# Patient Record
Sex: Female | Born: 1960 | Race: Black or African American | Hispanic: No | Marital: Married | Smoking: Never smoker
Health system: Southern US, Community
[De-identification: ages and names within clinical notes are randomized; demographics above are authoritative.]

## PROBLEM LIST (undated history)

## (undated) DIAGNOSIS — N809 Endometriosis, unspecified: Secondary | ICD-10-CM

## (undated) DIAGNOSIS — N8003 Adenomyosis of the uterus: Secondary | ICD-10-CM

## (undated) DIAGNOSIS — R0989 Other specified symptoms and signs involving the circulatory and respiratory systems: Secondary | ICD-10-CM

## (undated) DIAGNOSIS — N800 Endometriosis of uterus: Secondary | ICD-10-CM

## (undated) DIAGNOSIS — D219 Benign neoplasm of connective and other soft tissue, unspecified: Secondary | ICD-10-CM

## (undated) DIAGNOSIS — D649 Anemia, unspecified: Secondary | ICD-10-CM

## (undated) HISTORY — DX: Endometriosis of uterus: N80.0

## (undated) HISTORY — PX: BREAST BIOPSY: SHX20

## (undated) HISTORY — DX: Benign neoplasm of connective and other soft tissue, unspecified: D21.9

## (undated) HISTORY — DX: Endometriosis, unspecified: N80.9

## (undated) HISTORY — DX: Adenomyosis of the uterus: N80.03

## (undated) HISTORY — DX: Other specified symptoms and signs involving the circulatory and respiratory systems: R09.89

---

## 2000-11-10 HISTORY — PX: TUBAL LIGATION: SHX77

## 2002-02-22 ENCOUNTER — Encounter: Payer: Self-pay | Admitting: Ophthalmology

## 2002-02-22 ENCOUNTER — Encounter: Admission: RE | Admit: 2002-02-22 | Discharge: 2002-02-22 | Payer: Self-pay | Admitting: Ophthalmology

## 2004-09-28 ENCOUNTER — Encounter: Admission: RE | Admit: 2004-09-28 | Discharge: 2004-09-28 | Payer: Self-pay | Admitting: General Surgery

## 2004-10-06 ENCOUNTER — Encounter: Admission: RE | Admit: 2004-10-06 | Discharge: 2004-10-06 | Payer: Self-pay | Admitting: General Surgery

## 2005-03-30 ENCOUNTER — Encounter: Admission: RE | Admit: 2005-03-30 | Discharge: 2005-03-30 | Payer: Self-pay | Admitting: General Surgery

## 2005-10-11 ENCOUNTER — Encounter: Admission: RE | Admit: 2005-10-11 | Discharge: 2005-10-11 | Payer: Self-pay | Admitting: General Surgery

## 2006-12-29 ENCOUNTER — Other Ambulatory Visit: Admission: RE | Admit: 2006-12-29 | Discharge: 2006-12-29 | Payer: Self-pay | Admitting: Family Medicine

## 2007-01-03 ENCOUNTER — Ambulatory Visit (HOSPITAL_COMMUNITY): Admission: RE | Admit: 2007-01-03 | Discharge: 2007-01-03 | Payer: Self-pay | Admitting: Family Medicine

## 2007-01-03 ENCOUNTER — Ambulatory Visit: Payer: Self-pay | Admitting: Vascular Surgery

## 2011-01-06 ENCOUNTER — Encounter: Payer: Self-pay | Admitting: Family Medicine

## 2011-01-06 ENCOUNTER — Ambulatory Visit (INDEPENDENT_AMBULATORY_CARE_PROVIDER_SITE_OTHER): Payer: Self-pay | Admitting: Family Medicine

## 2011-01-06 VITALS — BP 125/79 | HR 95 | Ht 65.5 in | Wt 161.0 lb

## 2011-01-06 DIAGNOSIS — Z7689 Persons encountering health services in other specified circumstances: Secondary | ICD-10-CM

## 2011-01-06 DIAGNOSIS — Z0189 Encounter for other specified special examinations: Secondary | ICD-10-CM

## 2011-01-06 NOTE — Progress Notes (Signed)
  Subjective:    Patient ID: Belinda Welch, female    DOB: November 04, 1960, 50 y.o.   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 She will start on 01/10/11.  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  PCP: [facility] MMG: [place], due this month Pap smear: due this month, no history of abn pap LMP: 01/05/11   Review of Systems  Constitutional: Negative.   HENT: Negative.   Eyes: Negative.   Cardiovascular: Negative for chest pain and palpitations.  Gastrointestinal: Negative for nausea, vomiting, constipation, blood in stool and abdominal distention.  Genitourinary: Negative for dysuria, frequency, hematuria and dyspareunia.  Musculoskeletal: Negative for back pain, joint swelling, arthralgias and gait problem.  Neurological: Negative for dizziness, tremors, seizures, syncope, weakness, numbness and headaches.  Hematological: Negative.   Psychiatric/Behavioral: Negative.        Objective:   Physical Exam  Constitutional: She is oriented to person, place, and time. She appears well-developed and well-nourished. No distress.  HENT:  Head: Normocephalic and atraumatic.  Mouth/Throat: Oropharyngeal exudate present.  Eyes: Conjunctivae and EOM are normal. Pupils are equal, round, and reactive to light. Right eye exhibits no discharge. Left eye exhibits no discharge. No scleral icterus.  Neck: Normal range of motion. Neck supple. No tracheal deviation present. No thyromegaly present.  Cardiovascular: Normal rate, regular rhythm, normal heart sounds and intact distal pulses.   Pulmonary/Chest: Effort normal and breath sounds normal. No respiratory distress. She has no wheezes.  Abdominal: Soft. Bowel sounds are normal. She exhibits no distension. There is no tenderness.  Musculoskeletal: Normal range of motion. She exhibits no edema and no  tenderness.  Lymphadenopathy:    She has no cervical adenopathy.  Neurological: She is alert and oriented to person, place, and time. She has normal reflexes. No cranial nerve deficit. Coordination normal.  Skin: Skin is warm and dry.  Psychiatric: She has a normal mood and affect. Her behavior is normal. Judgment and thought content normal.          Assessment & Plan:

## 2011-01-06 NOTE — Assessment & Plan Note (Signed)
Pt is starting work at [employer] at [employer]. PCP is [facility].  She is healthy and takes no medications.  We discussed preventative health (weight, MMGs, Pap smear, colonscopy).  Physical exam wnl.  I've signed paperwork for pt to start 01/10/11.

## 2011-01-11 DIAGNOSIS — R0989 Other specified symptoms and signs involving the circulatory and respiratory systems: Secondary | ICD-10-CM

## 2011-01-11 HISTORY — DX: Other specified symptoms and signs involving the circulatory and respiratory systems: R09.89

## 2011-01-27 ENCOUNTER — Other Ambulatory Visit (HOSPITAL_COMMUNITY)
Admission: RE | Admit: 2011-01-27 | Discharge: 2011-01-27 | Disposition: A | Discharge status: Home / Self Care | Payer: Managed Care, Other (non HMO) | Source: Ambulatory Visit | Attending: Family Medicine | Admitting: Family Medicine

## 2011-01-27 ENCOUNTER — Other Ambulatory Visit: Payer: Self-pay | Admitting: Family Medicine

## 2011-01-27 DIAGNOSIS — Z0000 Encounter for general adult medical examination without abnormal findings: Secondary | ICD-10-CM | POA: Insufficient documentation

## 2011-08-02 ENCOUNTER — Encounter (HOSPITAL_COMMUNITY): Payer: Self-pay | Admitting: *Deleted

## 2011-08-10 ENCOUNTER — Inpatient Hospital Stay (HOSPITAL_COMMUNITY)
Admission: RE | Admit: 2011-08-10 | Payer: Managed Care, Other (non HMO) | Source: Ambulatory Visit | Admitting: Obstetrics and Gynecology

## 2011-08-10 ENCOUNTER — Encounter (HOSPITAL_COMMUNITY): Admission: RE | Payer: Self-pay | Source: Ambulatory Visit

## 2011-08-10 SURGERY — DILATATION & CURETTAGE/HYSTEROSCOPY WITH HYDROTHERMAL ABLATION
Anesthesia: General

## 2011-08-13 ENCOUNTER — Other Ambulatory Visit: Payer: Self-pay | Admitting: Obstetrics and Gynecology

## 2011-08-13 NOTE — H&P (Signed)
07/25/2011  History of Present Illness  General:  50 y/o presents for f/u on menorrhagia. Pt used Lysteda and it worked well. Pt does not want to continue to take pills so she would like to proceed with endometrial ablation.   Current Medications  Nu-Iron 150 MG Capsule 1 capsule Once a day  Vitamin D 1000 UNIT Capsule 4 capsule Once a day  Lysteda 650 MG Tablet 2 tablets Three times daily during menses  Aleve 220 MG Capsule 2 tablets during menses  Medication List reviewed and reconciled with the patient   Past Medical History  vitamin D deficiency, severe 5/12 7.0  Carotid bruit, carotid doppler, mild plaque bilaterally 5/12  Fibroid vs adenomyosis   Surgical History  BTL    Family History  Father: alive hypertension, diabetes, cancer prostate   Mother: alive hypertension, cholesterol, breast cancer 15   Paternal Grand Father: deceased DM   Paternal Grand Mother: deceased DM   Maternal Grand Father: deceased ? CAD   Maternal Grand Mother: deceased hypertension, colon cancer   Sister 1: alive hypertension   Sister 2: alive    Social History  General:  History of smoking  cigarettes: Never smoked no Smoking.  no Alcohol.  no Recreational drug use.  Exercise: yes, 1- times per week, Zumba.  Occupation: Data processing manager, Engineer, technical sales at [employer].  Marital Status: married, Dwain, Microbiologist for [employer].  Children: Jared -14, Myles (10).  Religion: Baptist.  Seat belt use: yes.    Gyn History  Periods : every month.  LMP 07/19/11.  Birth control BTL.  Last pap smear date 01/27/11, WNL.  Last mammogram date 03/18/11.  Denies H/O Abnormal pap smear .  Denies H/O STD .  GYN procedures Pelvic U/S 05/18/11.    OB History  Number of pregnancies 2.  Pregnancy # 1 live birth, vaginal delivery.  Pregnancy # 2 live birth, vaginal delivery.    Allergies  Tetracycline HCl: nausea   Hospitalization/Major Diagnostic Procedure  childbirth 97, 02   Review of  Systems  See HPI.   Vital Signs  Wt 166, Wt change 2 lb, Ht 65, BMI 27.62, Pulse sitting 76, BP sitting 118/78.   Physical Examination  GENERAL:  Patient appears alert and oriented.  General Appearance: well-appearing, well-developed, no acute distress.  Speech: clear.  NECK:  Thyroid: no thyromegaly.  LUNGS:  General clear bilaterally, no crackles, no wheezes.  HEART:  Heart sounds: normal, RRR.  ABDOMEN:  General: no masses tenderness or organomegaly.  FEMALE GENITOURINARY:  Cervix visualized, healthy appearing, no discharge, no lesions.  Adnexa: no mass, non tender.  Uterus: freely mobile, non tender, irregularly enlarged 14 weeks.  Vagina: pink/moist mucosa, no lesions, no abnormal discharge.  Vulva: normal, no lesions, no skin discoloration.  Anus: no external hemosrhoids.  EXTREMITIES:  general no edema.     Assessments   1. Menorrhagia with regular cycle - 626.2 (Primary)   Treatment  1. Menorrhagia with regular cycle  LAB: CBC WITH DIFF   WBC 7.1 4.0-11.0 - K/uL   RBC 4.20 4.20-5.40 - M/uL   HGB 11.2 12.0-16.0 - g/dL L  HCT 78.2 95.6-21.3 - % L  MCV 81.6 81.0-99.0 - fL   MCHC 32.6 32.0-36.0 - g/dL   RDW 08.6 57.8-46.9 - % H  PLT 351 150-400 - K/uL   NEUT % 62.0 43.3-71.9 - %   LYMPH% 24.6 16.8-43.5 - %   MONO % 11.4 4.6-12.4 - %   EOS % 1.20 0.00-7.80 - %  BASO % 0.8 0.0-1.0 - %   NEUT # 4.4 1.9-7.2 - K/uL   LYMPH# 1.70 1.10-2.73 - K/uL   MONO # 0.8 0.3-0.8 - K/uL   EOS # 0.10 0.00-0.60 - K/uL   BASO # 0.10 0.00-0.10 - K/uL    Janecia Palau B 07/26/2011 05:28:02 PM > Much improved. Continue iron therapy. Allman,Michelle 07/27/2011 03:53:28 PM > pt informed.   Proceed with HTA ablation. Preop clearance by PCP. Return for preoop.

## 2011-08-16 ENCOUNTER — Encounter (HOSPITAL_COMMUNITY): Payer: Self-pay | Admitting: Pharmacist

## 2011-08-24 ENCOUNTER — Ambulatory Visit (HOSPITAL_COMMUNITY): Payer: Managed Care, Other (non HMO) | Admitting: Anesthesiology

## 2011-08-24 ENCOUNTER — Encounter (HOSPITAL_COMMUNITY)
Admission: RE | Disposition: A | Discharge status: Home / Self Care | Payer: Self-pay | Source: Ambulatory Visit | Attending: Obstetrics and Gynecology

## 2011-08-24 ENCOUNTER — Other Ambulatory Visit: Payer: Self-pay | Admitting: Obstetrics and Gynecology

## 2011-08-24 ENCOUNTER — Ambulatory Visit (HOSPITAL_COMMUNITY)
Admission: RE | Admit: 2011-08-24 | Discharge: 2011-08-24 | Disposition: A | Discharge status: Home / Self Care | Payer: Managed Care, Other (non HMO) | Source: Ambulatory Visit | Attending: Obstetrics and Gynecology | Admitting: Obstetrics and Gynecology

## 2011-08-24 ENCOUNTER — Encounter (HOSPITAL_COMMUNITY): Payer: Self-pay | Admitting: *Deleted

## 2011-08-24 ENCOUNTER — Encounter (HOSPITAL_COMMUNITY): Payer: Self-pay | Admitting: Anesthesiology

## 2011-08-24 DIAGNOSIS — N920 Excessive and frequent menstruation with regular cycle: Secondary | ICD-10-CM | POA: Insufficient documentation

## 2011-08-24 HISTORY — PX: ABLATION: SHX5711

## 2011-08-24 HISTORY — DX: Anemia, unspecified: D64.9

## 2011-08-24 LAB — DIFFERENTIAL
Basophils Absolute: 0 10*3/uL (ref 0.0–0.1)
Basophils Relative: 1 % (ref 0–1)
Eosinophils Absolute: 0.1 10*3/uL (ref 0.0–0.7)
Monocytes Relative: 9 % (ref 3–12)
Neutrophils Relative %: 58 % (ref 43–77)

## 2011-08-24 LAB — CBC
Hemoglobin: 11.8 g/dL — ABNORMAL LOW (ref 12.0–15.0)
MCH: 25.7 pg — ABNORMAL LOW (ref 26.0–34.0)
MCHC: 31.8 g/dL (ref 30.0–36.0)
RDW: 18.3 % — ABNORMAL HIGH (ref 11.5–15.5)

## 2011-08-24 LAB — TYPE AND SCREEN: ABO/RH(D): O POS

## 2011-08-24 SURGERY — DILATATION & CURETTAGE/HYSTEROSCOPY WITH HYDROTHERMAL ABLATION
Anesthesia: Choice

## 2011-08-24 MED ORDER — OXYCODONE-ACETAMINOPHEN 5-325 MG PO TABS
ORAL_TABLET | ORAL | Status: AC
  Administered 2011-08-24: 1 via ORAL
  Filled 2011-08-24: qty 1

## 2011-08-24 MED ORDER — OXYCODONE-ACETAMINOPHEN 5-325 MG PO TABS
1.0000 | ORAL_TABLET | ORAL | Status: AC
  Administered 2011-08-24: 1 via ORAL

## 2011-08-24 MED ORDER — LACTATED RINGERS IV SOLN
INTRAVENOUS | Status: DC
  Administered 2011-08-24 (×2): via INTRAVENOUS

## 2011-08-24 MED ORDER — LIDOCAINE HCL (CARDIAC) 20 MG/ML IV SOLN
INTRAVENOUS | Status: DC | PRN
  Administered 2011-08-24: 40 mg via INTRAVENOUS

## 2011-08-24 MED ORDER — IBUPROFEN 600 MG PO TABS: 600.0000 mg | ORAL_TABLET | Freq: Four times a day (QID) | ORAL | Status: AC | PRN

## 2011-08-24 MED ORDER — DEXAMETHASONE SODIUM PHOSPHATE 10 MG/ML IJ SOLN
INTRAMUSCULAR | Status: AC
  Filled 2011-08-24: qty 1

## 2011-08-24 MED ORDER — DEXAMETHASONE SODIUM PHOSPHATE 10 MG/ML IJ SOLN
INTRAMUSCULAR | Status: DC | PRN
  Administered 2011-08-24: 10 mg via INTRAVENOUS

## 2011-08-24 MED ORDER — KETOROLAC TROMETHAMINE 30 MG/ML IJ SOLN
INTRAMUSCULAR | Status: AC
  Filled 2011-08-24: qty 1

## 2011-08-24 MED ORDER — FENTANYL CITRATE 0.05 MG/ML IJ SOLN
INTRAMUSCULAR | Status: AC
  Filled 2011-08-24: qty 2

## 2011-08-24 MED ORDER — MIDAZOLAM HCL 2 MG/2ML IJ SOLN
INTRAMUSCULAR | Status: AC
  Filled 2011-08-24: qty 2

## 2011-08-24 MED ORDER — CEFAZOLIN SODIUM 1-5 GM-% IV SOLN
INTRAVENOUS | Status: AC
  Filled 2011-08-24: qty 50

## 2011-08-24 MED ORDER — FENTANYL CITRATE 0.05 MG/ML IJ SOLN
INTRAMUSCULAR | Status: DC | PRN
  Administered 2011-08-24: 100 ug via INTRAVENOUS

## 2011-08-24 MED ORDER — LIDOCAINE HCL (PF) 1 % IJ SOLN
INTRAMUSCULAR | Status: DC | PRN
  Administered 2011-08-24: 10 mL

## 2011-08-24 MED ORDER — ONDANSETRON HCL 4 MG/2ML IJ SOLN
INTRAMUSCULAR | Status: AC
  Filled 2011-08-24: qty 2

## 2011-08-24 MED ORDER — CEFAZOLIN SODIUM 1-5 GM-% IV SOLN
1.0000 g | INTRAVENOUS | Status: AC
  Administered 2011-08-24: 1 g via INTRAVENOUS

## 2011-08-24 MED ORDER — FENTANYL CITRATE 0.05 MG/ML IJ SOLN: 25.0000 ug | INTRAMUSCULAR | Status: DC | PRN

## 2011-08-24 MED ORDER — LIDOCAINE HCL (CARDIAC) 20 MG/ML IV SOLN
INTRAVENOUS | Status: AC
  Filled 2011-08-24: qty 5

## 2011-08-24 MED ORDER — PROPOFOL 10 MG/ML IV EMUL
INTRAVENOUS | Status: AC
  Filled 2011-08-24: qty 20

## 2011-08-24 MED ORDER — KETOROLAC TROMETHAMINE 30 MG/ML IJ SOLN
INTRAMUSCULAR | Status: DC | PRN
  Administered 2011-08-24: 30 mg via INTRAVENOUS

## 2011-08-24 MED ORDER — PROPOFOL 10 MG/ML IV EMUL
INTRAVENOUS | Status: DC | PRN
  Administered 2011-08-24: 200 mg via INTRAVENOUS

## 2011-08-24 MED ORDER — OXYCODONE-ACETAMINOPHEN 5-325 MG PO TABS: ORAL_TABLET | ORAL | Status: DC

## 2011-08-24 MED ORDER — ONDANSETRON HCL 4 MG/2ML IJ SOLN
INTRAMUSCULAR | Status: DC | PRN
  Administered 2011-08-24: 4 mg via INTRAVENOUS

## 2011-08-24 MED ORDER — MIDAZOLAM HCL 5 MG/5ML IJ SOLN
INTRAMUSCULAR | Status: DC | PRN
  Administered 2011-08-24: 2 mg via INTRAVENOUS

## 2011-08-24 SURGICAL SUPPLY — 14 items
CANISTER SUCTION 2500CC (MISCELLANEOUS) ×2 IMPLANT
CATH ROBINSON RED A/P 16FR (CATHETERS) ×2 IMPLANT
CLOTH BEACON ORANGE TIMEOUT ST (SAFETY) ×2 IMPLANT
CONTAINER PREFILL 10% NBF 60ML (FORM) ×4 IMPLANT
DILATOR CANAL MILEX (MISCELLANEOUS) IMPLANT
GLOVE BIO SURGEON STRL SZ7 (GLOVE) ×2 IMPLANT
GLOVE BIOGEL PI IND STRL 7.0 (GLOVE) ×2 IMPLANT
GLOVE BIOGEL PI INDICATOR 7.0 (GLOVE) ×2
GOWN PREVENTION PLUS LG XLONG (DISPOSABLE) ×2 IMPLANT
GOWN STRL REIN XL XLG (GOWN DISPOSABLE) ×2 IMPLANT
PACK HYSTEROSCOPY LF (CUSTOM PROCEDURE TRAY) ×2 IMPLANT
SET GENESYS HTA PROCERVA (MISCELLANEOUS) ×2 IMPLANT
TOWEL OR 17X24 6PK STRL BLUE (TOWEL DISPOSABLE) ×4 IMPLANT
WATER STERILE IRR 1000ML POUR (IV SOLUTION) ×2 IMPLANT

## 2011-08-24 NOTE — Brief Op Note (Signed)
08/24/2011  12:29 PM  PATIENT:  Belinda Welch  50 y.o. female  PRE-OPERATIVE DIAGNOSIS:  Menorrhagia, Uterine Fibroids  POST-OPERATIVE DIAGNOSIS:  Menorrhagia, Uterine Fibroids  PROCEDURE:  Procedure(s): DILATATION & CURETTAGE/HYSTEROSCOPY WITH HYDROTHERMAL ABLATION  SURGEON:  Surgeon(s): Geryl Rankins, MD  PHYSICIAN ASSISTANT: None  ASSISTANTS: Technician   ANESTHESIA:   LMA  EBL:  Total I/O In: 1000 [I.V.:1000] Out: 75 [Urine:75]  BLOOD ADMINISTERED:none  DRAINS: None  LOCAL MEDICATIONS USED:  LIDOCAINE 1% 7 CC  SPECIMEN:  Source of Specimen:  Endometrial currettings  DISPOSITION OF SPECIMEN:  PATHOLOGY  COUNTS:  YES  TOURNIQUET:  * No tourniquets in log *  DICTATION: .Other Dictation: Dictation Number [id number]  PLAN OF CARE: Discharge to home after PACU  PATIENT DISPOSITION:  PACU - hemodynamically stable.   Delay start of Pharmacological VTE agent (>24hrs) due to surgical blood loss or risk of bleeding:  {YES/NO/NOT APPLICABLE:20182

## 2011-08-24 NOTE — Anesthesia Postprocedure Evaluation (Signed)
  Anesthesia Post-op Note  Patient: Belinda Welch  Procedure(s) Performed:  DILATATION & CURETTAGE/HYSTEROSCOPY WITH HYDROTHERMAL ABLATION  Patient is awake and responsive. Pain and nausea are reasonably well controlled. Vital signs are stable and clinically acceptable. Oxygen saturation is clinically acceptable. There are no apparent anesthetic complications at this time. Patient is ready for discharge.

## 2011-08-24 NOTE — H&P (View-Only) (Signed)
07/25/2011  History of Present Illness  General:  50 y/o presents for f/u on menorrhagia. Pt used Lysteda and it worked well. Pt does not want to continue to take pills so she would like to proceed with endometrial ablation.   Current Medications  Nu-Iron 150 MG Capsule 1 capsule Once a day  Vitamin D 1000 UNIT Capsule 4 capsule Once a day  Lysteda 650 MG Tablet 2 tablets Three times daily during menses  Aleve 220 MG Capsule 2 tablets during menses  Medication List reviewed and reconciled with the patient   Past Medical History  vitamin D deficiency, severe 5/12 7.0  Carotid bruit, carotid doppler, mild plaque bilaterally 5/12  Fibroid vs adenomyosis   Surgical History  BTL    Family History  Father: alive hypertension, diabetes, cancer prostate   Mother: alive hypertension, cholesterol, breast cancer 49   Paternal Grand Father: deceased DM   Paternal Grand Mother: deceased DM   Maternal Grand Father: deceased ? CAD   Maternal Grand Mother: deceased hypertension, colon cancer   Sister 1: alive hypertension   Sister 2: alive    Social History  General:  History of smoking  cigarettes: Never smoked no Smoking.  no Alcohol.  no Recreational drug use.  Exercise: yes, 1- times per week, Zumba.  Occupation: Assistant Teacher, [employer] at [employer].  Marital Status: married, Dwain, Territy manager for [employer].  Children: Jared -14, Myles (10).  Religion: Baptist.  Seat belt use: yes.    Gyn History  Periods : every month.  LMP 07/19/11.  Birth control BTL.  Last pap smear date 01/27/11, WNL.  Last mammogram date 03/18/11.  Denies H/O Abnormal pap smear .  Denies H/O STD .  GYN procedures Pelvic U/S 05/18/11.    OB History  Number of pregnancies 2.  Pregnancy # 1 live birth, vaginal delivery.  Pregnancy # 2 live birth, vaginal delivery.    Allergies  Tetracycline HCl: nausea   Hospitalization/Major Diagnostic Procedure  childbirth 97, 02   Review of  Systems  See HPI.   Vital Signs  Wt 166, Wt change 2 lb, Ht 65, BMI 27.62, Pulse sitting 76, BP sitting 118/78.   Physical Examination  GENERAL:  Patient appears alert and oriented.  General Appearance: well-appearing, well-developed, no acute distress.  Speech: clear.  NECK:  Thyroid: no thyromegaly.  LUNGS:  General clear bilaterally, no crackles, no wheezes.  HEART:  Heart sounds: normal, RRR.  ABDOMEN:  General: no masses tenderness or organomegaly.  FEMALE GENITOURINARY:  Cervix visualized, healthy appearing, no discharge, no lesions.  Adnexa: no mass, non tender.  Uterus: freely mobile, non tender, irregularly enlarged 14 weeks.  Vagina: pink/moist mucosa, no lesions, no abnormal discharge.  Vulva: normal, no lesions, no skin discoloration.  Anus: no external hemosrhoids.  EXTREMITIES:  general no edema.     Assessments   1. Menorrhagia with regular cycle - 626.2 (Primary)   Treatment  1. Menorrhagia with regular cycle  LAB: CBC WITH DIFF   WBC 7.1 4.0-11.0 - K/uL   RBC 4.20 4.20-5.40 - M/uL   HGB 11.2 12.0-16.0 - g/dL L  HCT 34.3 37.0-47.0 - % L  MCV 81.6 81.0-99.0 - fL   MCHC 32.6 32.0-36.0 - g/dL   RDW 18.2 11.5-15.5 - % H  PLT 351 150-400 - K/uL   NEUT % 62.0 43.3-71.9 - %   LYMPH% 24.6 16.8-43.5 - %   MONO % 11.4 4.6-12.4 - %   EOS % 1.20 0.00-7.80 - %     BASO % 0.8 0.0-1.0 - %   NEUT # 4.4 1.9-7.2 - K/uL   LYMPH# 1.70 1.10-2.73 - K/uL   MONO # 0.8 0.3-0.8 - K/uL   EOS # 0.10 0.00-0.60 - K/uL   BASO # 0.10 0.00-0.10 - K/uL    Cara Aguino B 07/26/2011 05:28:02 PM > Much improved. Continue iron therapy. Allman,Michelle 07/27/2011 03:53:28 PM > pt informed.   Proceed with HTA ablation. Preop clearance by PCP. Return for preoop.       

## 2011-08-24 NOTE — Transfer of Care (Signed)
Immediate Anesthesia Transfer of Care Note  Patient: Belinda Welch  Procedure(s) Performed:  DILATATION & CURETTAGE/HYSTEROSCOPY WITH HYDROTHERMAL ABLATION  Patient Location: PACU  Anesthesia Type: General  Level of Consciousness: awake and alert   Airway & Oxygen Therapy: Patient Spontanous Breathing  Post-op Assessment: Report given to PACU RN  Post vital signs: Reviewed and stable  Complications: No apparent anesthesia complications

## 2011-08-24 NOTE — Anesthesia Preprocedure Evaluation (Addendum)
Anesthesia Evaluation  Patient identified by MRN, date of birth, ID band Patient awake    Reviewed: Allergy & Precautions, H&P , Patient's Chart, lab work & pertinent test results, reviewed documented beta blocker date and time   Airway Mallampati: II TM Distance: >3 FB Neck ROM: full    Dental No notable dental hx.    Pulmonary  clear to auscultation  Pulmonary exam normal       Cardiovascular regular Normal    Neuro/Psych    GI/Hepatic   Endo/Other    Renal/GU      Musculoskeletal   Abdominal   Peds  Hematology   Anesthesia Other Findings   Reproductive/Obstetrics                           Anesthesia Physical Anesthesia Plan  ASA: II  Anesthesia Plan: General   Post-op Pain Management:    Induction: Intravenous  Airway Management Planned: LMA  Additional Equipment:   Intra-op Plan:   Post-operative Plan:   Informed Consent: I have reviewed the patients History and Physical, chart, labs and discussed the procedure including the risks, benefits and alternatives for the proposed anesthesia with the patient or authorized representative who has indicated his/her understanding and acceptance.   Dental Advisory Given  Plan Discussed with: CRNA and Surgeon  Anesthesia Plan Comments: (  Discussed  general anesthesia, including possible nausea, instrumentation of airway, sore throat,pulmonary aspiration, etc. I asked if the were any outstanding questions, or  concerns before we proceeded. )        Anesthesia Quick Evaluation  

## 2011-08-24 NOTE — Interval H&P Note (Signed)
History and Physical Interval Note:  08/24/2011 12:28 PM  Belinda Welch  has presented today for surgery, with the diagnosis of Menorrhagia  The various methods of treatment have been discussed with the patient and family. After consideration of risks, benefits and other options for treatment, the patient has consented to  Procedure(s): DILATATION & CURETTAGE/HYSTEROSCOPY WITH HYDROTHERMAL ABLATION as a surgical intervention .  The patients' history has been reviewed, patient examined, no change in status, stable for surgery.  I have reviewed the patients' chart and labs.  Questions were answered to the patient's satisfaction.     Dion Body, Ania Levay

## 2011-08-25 NOTE — Op Note (Signed)
NAME:  Belinda Welch, VORHEES NO.:  [id number]  MEDICAL RECORD NO.:  [id number]  LOCATION:  [facility]                          [facility]ACILITY:  [facility]  PHYSICIAN:  Pieter Partridge, MD   DATE OF BIRTH:  [birth year]  DATE OF PROCEDURE:  08/24/2011 DATE OF DISCHARGE:  08/24/2011                              OPERATIVE REPORT   PREOPERATIVE DIAGNOSES:  Menorrhagia, uterine fibroids.  POSTOPERATIVE DIAGNOSES:  Menorrhagia, uterine fibroids.  PROCEDURE:  Dilation and curettage, hysteroscopy with hydrothermal ablation.  SURGEON:  Pieter Partridge, MD  PHYSICIAN ASSISTANT:  None.  TECHNICIAN:  As Geophysicist/field seismologist.  ANESTHESIA:  LMA.  ESTIMATED BLOOD LOSS:  Minimal.  FLUIDS:  1000 of fluids in.  URINE:  75 mL of urine out prior to beginning of case.  In and out cath was performed.  LOCAL MEDICATIONS:  Lidocaine 1%, 7 mL used.  SPECIMEN:  Endometrial curettings to path.  DISPOSITION:  To PACU hemodynamically stable.  FINDINGS:  Large uterine cavity with normal appearance of endometrial tissue.  No submucosal fibroids noted.  Ostia well visualized, a good ablation status post procedure.  PROCEDURE IN DETAIL:  [name] was identified in the holding area.  She was then taken to the operating room with IV running.  She was placed in the dorsal supine position and underwent LMA anesthesia without complication.  She was then placed in the dorsal lithotomy position and prepped and draped in a normal sterile fashion.  An I and O catheterization of the bladder was performed revealing 75 mL.  A Graves speculum was inserted into the vagina as the patient was placed in Trendelenburg.  The anterior lip of the cervix was injected with 1% lidocaine approximately 3 mL.  A single-tooth tenaculum was then applied.  An additional 4 was applied at 4 and 8 o'clock position.  The cervix was then dilated up to an 8 Hegar.  The uterus was sounded to approximately almost initially 7-cm, but that was  prior to dilatation, believe that was probably closer to 10.  The hysteroscopic device with ACA was inserted after a curettage of all 4 quadrants of the uterus were performed revealing minimal to moderate amount of tissue.  Once the hysteroscope was advanced, the tubal ostia were visualized and the hysteroscope was then pulled back to mid cavity and the ACA ablation was activated and it continued without complication.  Once the procedure was completed, the tissue was blanched in its entirety and all devices were removed.  Single-tooth tenaculum was removed as well.  Hemostasis was achieved with silver nitrate at the tenaculum site.  All instruments were removed from the vagina.  The patient tolerated the procedure well.  She had Ancef 1 g prior to the procedure.  She had SCDs on throughout the whole procedure.  All instrument and sponge counts were correct x3.  She was taken to the PACU in stable condition.     Pieter Partridge, MD     EBV/MEDQ  D:  08/24/2011  T:  08/25/2011  Job:  [id number]

## 2011-10-14 ENCOUNTER — Other Ambulatory Visit: Payer: Self-pay | Admitting: Family Medicine

## 2012-02-13 ENCOUNTER — Other Ambulatory Visit (HOSPITAL_COMMUNITY)
Admission: RE | Admit: 2012-02-13 | Discharge: 2012-02-13 | Disposition: A | Discharge status: Home / Self Care | Payer: Managed Care, Other (non HMO) | Source: Ambulatory Visit | Attending: Obstetrics and Gynecology | Admitting: Obstetrics and Gynecology

## 2012-02-13 ENCOUNTER — Other Ambulatory Visit: Payer: Self-pay | Admitting: Obstetrics and Gynecology

## 2012-02-13 DIAGNOSIS — Z01419 Encounter for gynecological examination (general) (routine) without abnormal findings: Secondary | ICD-10-CM | POA: Insufficient documentation

## 2012-02-13 DIAGNOSIS — Z1159 Encounter for screening for other viral diseases: Secondary | ICD-10-CM | POA: Insufficient documentation

## 2013-03-01 ENCOUNTER — Other Ambulatory Visit (HOSPITAL_COMMUNITY)
Admission: RE | Admit: 2013-03-01 | Discharge: 2013-03-01 | Disposition: A | Discharge status: Home / Self Care | Payer: Managed Care, Other (non HMO) | Source: Ambulatory Visit | Attending: Obstetrics and Gynecology | Admitting: Obstetrics and Gynecology

## 2013-03-01 ENCOUNTER — Other Ambulatory Visit: Payer: Self-pay | Admitting: Obstetrics and Gynecology

## 2013-03-01 DIAGNOSIS — Z01419 Encounter for gynecological examination (general) (routine) without abnormal findings: Secondary | ICD-10-CM | POA: Insufficient documentation

## 2017-01-09 ENCOUNTER — Other Ambulatory Visit (HOSPITAL_COMMUNITY)
Admission: RE | Admit: 2017-01-09 | Discharge: 2017-01-09 | Disposition: A | Discharge status: Home / Self Care | Payer: BLUE CROSS/BLUE SHIELD | Source: Ambulatory Visit | Attending: Family Medicine | Admitting: Family Medicine

## 2017-01-09 ENCOUNTER — Other Ambulatory Visit: Payer: Self-pay | Admitting: Family Medicine

## 2017-01-09 DIAGNOSIS — Z1151 Encounter for screening for human papillomavirus (HPV): Secondary | ICD-10-CM | POA: Insufficient documentation

## 2017-01-09 DIAGNOSIS — Z0001 Encounter for general adult medical examination with abnormal findings: Secondary | ICD-10-CM | POA: Diagnosis present

## 2017-01-12 LAB — CYTOLOGY - PAP
DIAGNOSIS: NEGATIVE
HPV: NOT DETECTED

## 2017-04-17 ENCOUNTER — Other Ambulatory Visit: Payer: Self-pay | Admitting: Family Medicine

## 2017-04-17 DIAGNOSIS — Z1231 Encounter for screening mammogram for malignant neoplasm of breast: Secondary | ICD-10-CM

## 2017-05-23 ENCOUNTER — Ambulatory Visit
Admission: RE | Admit: 2017-05-23 | Discharge: 2017-05-23 | Disposition: A | Discharge status: Home / Self Care | Payer: BLUE CROSS/BLUE SHIELD | Source: Ambulatory Visit | Attending: Family Medicine | Admitting: Family Medicine

## 2017-05-23 DIAGNOSIS — Z1231 Encounter for screening mammogram for malignant neoplasm of breast: Secondary | ICD-10-CM

## 2017-07-25 ENCOUNTER — Other Ambulatory Visit: Payer: Self-pay | Admitting: Family Medicine

## 2017-07-25 DIAGNOSIS — N644 Mastodynia: Secondary | ICD-10-CM

## 2017-08-07 ENCOUNTER — Ambulatory Visit
Admission: RE | Admit: 2017-08-07 | Discharge: 2017-08-07 | Disposition: A | Discharge status: Home / Self Care | Payer: BLUE CROSS/BLUE SHIELD | Source: Ambulatory Visit | Attending: Family Medicine | Admitting: Family Medicine

## 2017-08-07 DIAGNOSIS — N644 Mastodynia: Secondary | ICD-10-CM

## 2019-03-03 ENCOUNTER — Other Ambulatory Visit: Payer: Self-pay

## 2019-03-03 ENCOUNTER — Encounter (HOSPITAL_COMMUNITY): Payer: Self-pay | Admitting: Family Medicine

## 2019-03-03 ENCOUNTER — Ambulatory Visit (HOSPITAL_COMMUNITY)
Admission: EM | Admit: 2019-03-03 | Discharge: 2019-03-03 | Disposition: A | Discharge status: Home / Self Care | Payer: BLUE CROSS/BLUE SHIELD | Attending: Family Medicine | Admitting: Family Medicine

## 2019-03-03 DIAGNOSIS — S39012A Strain of muscle, fascia and tendon of lower back, initial encounter: Secondary | ICD-10-CM | POA: Insufficient documentation

## 2019-03-03 LAB — POCT URINALYSIS DIP (DEVICE)
Bilirubin Urine: NEGATIVE
Glucose, UA: NEGATIVE mg/dL
Ketones, ur: NEGATIVE mg/dL
Leukocytes,Ua: NEGATIVE
Nitrite: NEGATIVE
Protein, ur: NEGATIVE mg/dL
Specific Gravity, Urine: >=1.03 (ref 1.005–1.030)
Urobilinogen, UA: 0.2 mg/dL (ref 0.0–1.0)
pH: 6 (ref 5.0–8.0)

## 2019-03-03 MED ORDER — CYCLOBENZAPRINE HCL 5 MG PO TABS: 5.0000 mg | ORAL_TABLET | Freq: Every day | ORAL | 0 refills | Status: AC

## 2019-03-03 MED ORDER — PREDNISONE 20 MG PO TABS: 20.0000 mg | ORAL_TABLET | ORAL | 0 refills | Status: AC

## 2019-03-03 NOTE — Discharge Instructions (Signed)
Return if symptoms worsen or persist

## 2019-03-03 NOTE — ED Provider Notes (Addendum)
[facility]    CSN: [id number] Arrival date & time: 03/03/19  1700     History   Chief Complaint Chief Complaint  Patient presents with  . Back Pain    HPI Belinda Welch is a 58 y.o. female.   Initial [place] visit for this 58 yo woman with back pain.  Unbeknownst to her, she is running a fever.  No lower back pain began in the lumbar region of her back 2 days ago.  She has had no dysuria or frequency.  She has had no injury to her back.  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  After work she noted pain in her left lower back when she walked up steps, worse by twisting or bending side to side.  No cough, diarrhea, headache, painful toes or rash.     Past Medical History:  Diagnosis Date  . Adenomyosis   . Anemia   . Carotid bruit 5/12   carotid doppler, mild plaque bilaterally  . Fibroids     Patient Active Problem List   Diagnosis Date Noted  . Return to work exam 01/06/2011    Past Surgical History:  Procedure Laterality Date  . ABLATION  08/24/11   dr. Suzzette Righter   . BREAST BIOPSY    . TUBAL LIGATION  11/2000    OB History   No obstetric history on file.      Home Medications    Prior to Admission medications   Medication Sig Start Date End Date Taking? Authorizing Provider  cholecalciferol (VITAMIN D) 1000 UNITS tablet Take 4,000 Units by mouth daily.     Yes [provider]  latanoprost (XALATAN) 0.005 % ophthalmic solution 1 drop at bedtime.   Yes [provider]  cyclobenzaprine (FLEXERIL) 5 MG tablet Take 1 tablet (5 mg total) by mouth at bedtime. 03/03/19   Robyn Haber, MD  predniSONE (DELTASONE) 20 MG tablet Take 1 tablet (20 mg total) by mouth 1 day or 1 dose for 5 doses. one daily with food 03/03/19 03/08/19  Robyn Haber, MD    Family History Family History  Problem Relation Age of Onset  . Hypertension Mother   . Breast cancer Mother   . Diabetes Father   .  Hypertension Father   . Prostate cancer Father     Social History Social History   Tobacco Use  . Smoking status: Never Smoker  . Smokeless tobacco: Never Used  Substance Use Topics  . Alcohol use: No  . Drug use: No     Allergies   Tetracyclines & related   Review of Systems Review of Systems  Constitutional: Negative.   HENT: Negative.   Respiratory: Negative.   Cardiovascular: Negative.   Gastrointestinal: Negative.   Genitourinary: Negative.   Musculoskeletal: Positive for back pain.  Neurological: Negative.  Negative for headaches.  Psychiatric/Behavioral: Negative.   All other systems reviewed and are negative.    Physical Exam Triage Vital Signs ED Triage Vitals [03/03/19 1717]  Enc Vitals Group     BP (!) 147/89     Pulse Rate (!) 122     Resp 16     Temp 100.2 F (37.9 C)     Temp Source Oral     SpO2 96 %     Weight      Height      Head Circumference      Peak Flow  Pain Score      Pain Loc      Pain Edu?      Excl. in [place]?    No data found.  Updated Vital Signs BP (!) 147/89 (BP Location: Left Arm) Comment: RN notified of critical vital signs. SP   Pulse (!) 122 Comment: RN notified of critical vital signs. SP   Temp 100.2 F (37.9 C) (Oral)   Resp 16   LMP 02/17/2019 (Exact Date)   SpO2 96%    Physical Exam Vitals signs and nursing note reviewed.  Constitutional:      General: She is not in acute distress.    Appearance: Normal appearance. She is obese. She is not ill-appearing or toxic-appearing.  HENT:     Head: Normocephalic.     Right Ear: External ear normal.     Left Ear: External ear normal.     Mouth/Throat:     Pharynx: Oropharynx is clear.  Eyes:     Conjunctiva/sclera: Conjunctivae normal.  Neck:     Musculoskeletal: Normal range of motion and neck supple.  Cardiovascular:     Rate and Rhythm: Tachycardia present.     Heart sounds: Normal heart sounds.  Pulmonary:     Effort: Pulmonary effort is normal.      Breath sounds: Normal breath sounds.  Musculoskeletal: Normal range of motion.  Skin:    General: Skin is warm and dry.  Neurological:     General: No focal deficit present.     Mental Status: She is alert.  Psychiatric:        Mood and Affect: Mood normal.      UC Treatments / Results  Labs (all labs ordered are listed, but only abnormal results are displayed) Labs Reviewed  POCT URINALYSIS DIP (DEVICE) - Abnormal; Notable for the following components:      Result Value   Hgb urine dipstick TRACE (*)    All other components within normal limits  URINE CULTURE    EKG None  Radiology No results found.  Procedures Procedures (including critical care time)  Medications Ordered in UC Medications - No data to display  Initial Impression / Assessment and Plan / UC Course  I have reviewed the triage vital signs and the nursing notes.  Pertinent labs & imaging results that were available during my care of the patient were reviewed by me and considered in my medical decision making (see chart for details).    Final Clinical Impressions(s) / UC Diagnoses   Final diagnoses:  Strain of lumbar region, initial encounter     Discharge Instructions     Return if symptoms worsen or persist    ED Prescriptions    Medication Sig Dispense Auth. Provider   predniSONE (DELTASONE) 20 MG tablet Take 1 tablet (20 mg total) by mouth 1 day or 1 dose for 5 doses. one daily with food 5 tablet Robyn Haber, MD   cyclobenzaprine (FLEXERIL) 5 MG tablet Take 1 tablet (5 mg total) by mouth at bedtime. 7 tablet Robyn Haber, MD     Controlled Substance Prescriptions [place] Controlled Substance Registry consulted? Not Applicable   Robyn Haber, MD 03/03/19 Levada Schilling    Robyn Haber, MD 03/03/19 1810

## 2019-03-03 NOTE — ED Triage Notes (Signed)
C/O mid back pain with sudden onset while moving cots 2 days ago.  Describes pain as constant, worse with movement. Denies any known fevers at home.  States feels well except for her back.  Denies urinary sxs.

## 2019-03-04 LAB — URINE CULTURE: Culture: <10000 — AB

## 2019-04-26 ENCOUNTER — Other Ambulatory Visit: Payer: Self-pay

## 2019-04-26 DIAGNOSIS — Z20822 Contact with and (suspected) exposure to covid-19: Secondary | ICD-10-CM

## 2019-04-28 LAB — NOVEL CORONAVIRUS, NAA: SARS-CoV-2, NAA: NOT DETECTED

## 2019-11-28 ENCOUNTER — Ambulatory Visit: Payer: BLUE CROSS/BLUE SHIELD | Attending: Family

## 2019-11-28 DIAGNOSIS — Z23 Encounter for immunization: Secondary | ICD-10-CM

## 2019-11-28 NOTE — Progress Notes (Signed)
   Covid-19 Vaccination Clinic  Name:  Belinda Welch    MRN: [id number] DOB: July 26, 1961  11/28/2019  [name] was observed post Covid-19 immunization for 15 minutes without incident. She was provided with Vaccine Information Sheet and instruction to access the V-Safe system.   [name] was instructed to call 911 with any severe reactions post vaccine: Marland Kitchen Difficulty breathing  . Swelling of face and throat  . A fast heartbeat  . A bad rash all over body  . Dizziness and weakness   Immunizations Administered    Name Date Dose VIS Date Route   Moderna COVID-19 Vaccine 11/28/2019  4:20 PM 0.5 mL 08/13/2019 Intramuscular   Manufacturer: Moderna   Lot: [id number]   [id number]

## 2019-12-31 ENCOUNTER — Ambulatory Visit: Payer: BLUE CROSS/BLUE SHIELD | Attending: Family

## 2019-12-31 DIAGNOSIS — Z23 Encounter for immunization: Secondary | ICD-10-CM

## 2019-12-31 NOTE — Progress Notes (Signed)
   Covid-19 Vaccination Clinic  Name:  Belinda Welch    MRN: [id number] DOB: [birth year]  12/31/2019  [name] was observed post Covid-19 immunization for 15 minutes without incident. She was provided with Vaccine Information Sheet and instruction to access the V-Safe system.   [name] was instructed to call 911 with any severe reactions post vaccine: Marland Kitchen Difficulty breathing  . Swelling of face and throat  . A fast heartbeat  . A bad rash all over body  . Dizziness and weakness   Immunizations Administered    Name Date Dose VIS Date Route   Moderna COVID-19 Vaccine 12/31/2019  4:32 PM 0.5 mL 08/2019 Intramuscular   Manufacturer: Moderna   Lot: [id number]   [id number]

## 2020-01-08 ENCOUNTER — Other Ambulatory Visit: Payer: Self-pay | Admitting: Family Medicine

## 2020-01-08 ENCOUNTER — Other Ambulatory Visit (HOSPITAL_COMMUNITY)
Admission: RE | Admit: 2020-01-08 | Discharge: 2020-01-08 | Disposition: A | Discharge status: Home / Self Care | Payer: 59 | Source: Ambulatory Visit | Attending: Family Medicine | Admitting: Family Medicine

## 2020-01-08 DIAGNOSIS — Z0000 Encounter for general adult medical examination without abnormal findings: Secondary | ICD-10-CM | POA: Insufficient documentation

## 2020-01-10 LAB — CYTOLOGY - PAP
Comment: NEGATIVE
Diagnosis: NEGATIVE
High risk HPV: NEGATIVE

## 2020-03-19 ENCOUNTER — Other Ambulatory Visit: Payer: Self-pay | Admitting: Family Medicine

## 2020-03-19 DIAGNOSIS — Z1231 Encounter for screening mammogram for malignant neoplasm of breast: Secondary | ICD-10-CM

## 2020-04-07 ENCOUNTER — Other Ambulatory Visit: Payer: Self-pay

## 2020-04-07 ENCOUNTER — Ambulatory Visit
Admission: RE | Admit: 2020-04-07 | Discharge: 2020-04-07 | Disposition: A | Discharge status: Home / Self Care | Payer: 59 | Source: Ambulatory Visit | Attending: Family Medicine | Admitting: Family Medicine

## 2020-04-07 DIAGNOSIS — Z1231 Encounter for screening mammogram for malignant neoplasm of breast: Secondary | ICD-10-CM

## 2020-04-09 ENCOUNTER — Other Ambulatory Visit: Payer: Self-pay | Admitting: Family Medicine

## 2020-04-09 DIAGNOSIS — R928 Other abnormal and inconclusive findings on diagnostic imaging of breast: Secondary | ICD-10-CM

## 2020-04-21 ENCOUNTER — Ambulatory Visit
Admission: RE | Admit: 2020-04-21 | Discharge: 2020-04-21 | Disposition: A | Discharge status: Home / Self Care | Payer: 59 | Source: Ambulatory Visit | Attending: Family Medicine | Admitting: Family Medicine

## 2020-04-21 ENCOUNTER — Other Ambulatory Visit: Payer: Self-pay

## 2020-04-21 ENCOUNTER — Other Ambulatory Visit: Payer: Self-pay | Admitting: Family Medicine

## 2020-04-21 DIAGNOSIS — R928 Other abnormal and inconclusive findings on diagnostic imaging of breast: Secondary | ICD-10-CM

## 2020-04-21 DIAGNOSIS — N6310 Unspecified lump in the right breast, unspecified quadrant: Secondary | ICD-10-CM

## 2020-04-23 ENCOUNTER — Ambulatory Visit
Admission: RE | Admit: 2020-04-23 | Discharge: 2020-04-23 | Disposition: A | Discharge status: Home / Self Care | Payer: 59 | Source: Ambulatory Visit | Attending: Family Medicine | Admitting: Family Medicine

## 2020-04-23 ENCOUNTER — Other Ambulatory Visit: Payer: Self-pay

## 2020-04-23 ENCOUNTER — Other Ambulatory Visit: Payer: Self-pay | Admitting: Family Medicine

## 2020-04-23 DIAGNOSIS — N6310 Unspecified lump in the right breast, unspecified quadrant: Secondary | ICD-10-CM

## 2020-04-23 HISTORY — PX: BREAST BIOPSY: SHX20

## 2021-05-11 ENCOUNTER — Other Ambulatory Visit: Payer: Self-pay | Admitting: Family Medicine

## 2021-05-11 DIAGNOSIS — Z1231 Encounter for screening mammogram for malignant neoplasm of breast: Secondary | ICD-10-CM

## 2021-06-28 ENCOUNTER — Other Ambulatory Visit: Payer: Self-pay

## 2021-06-28 ENCOUNTER — Ambulatory Visit
Admission: RE | Admit: 2021-06-28 | Discharge: 2021-06-28 | Disposition: A | Discharge status: Home / Self Care | Payer: 59 | Source: Ambulatory Visit | Attending: Family Medicine | Admitting: Family Medicine

## 2021-06-28 DIAGNOSIS — Z1231 Encounter for screening mammogram for malignant neoplasm of breast: Secondary | ICD-10-CM

## 2021-08-24 IMAGING — US US  BREAST BX W/ LOC DEV 1ST LESION IMG BX SPEC US GUIDE*R*
1 series · 10 of 10 positions shown · non-contrast
Comparison: Previous exam(s).
COMPARISON: Previous exam(s).

Addendum:
CLINICAL DATA: Biopsy of a right breast mass

EXAM:
ULTRASOUND GUIDED RIGHT BREAST CORE NEEDLE BIOPSY

[Series 1: us breast bx w/ loc dev 1st lesion img bx spec us  · 0.08mm/px · 10 of 10 slices shown]
[im 1/10]
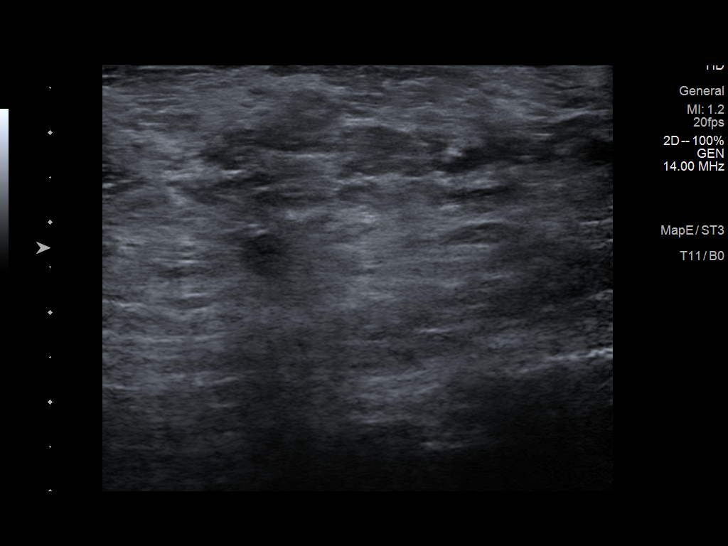
[im 2/10]
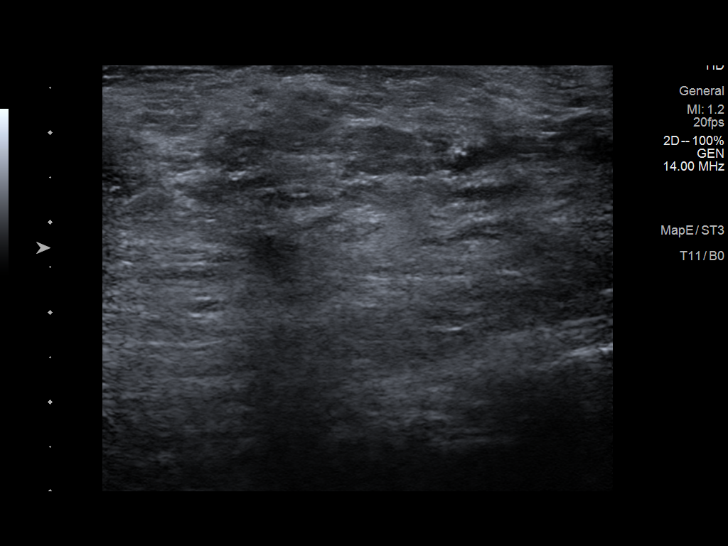
[im 3/10]
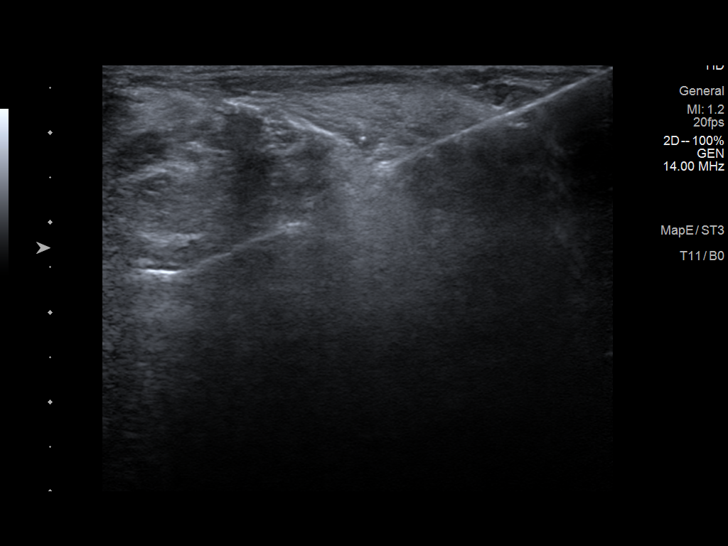
[im 4/10]
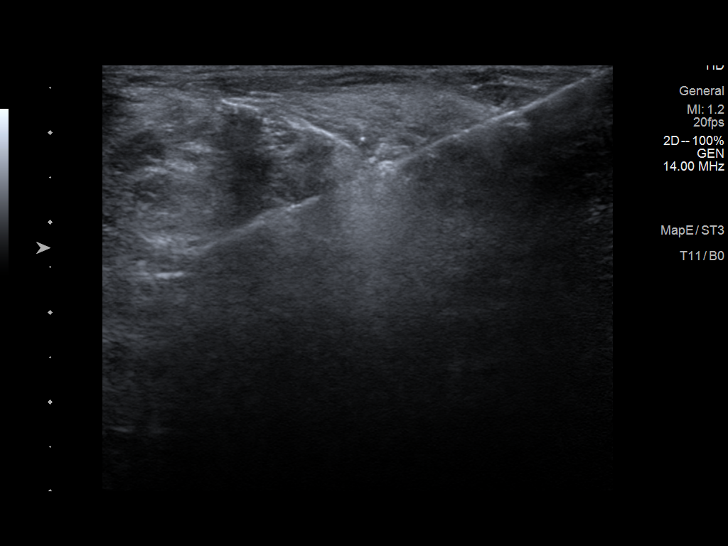
[im 5/10]
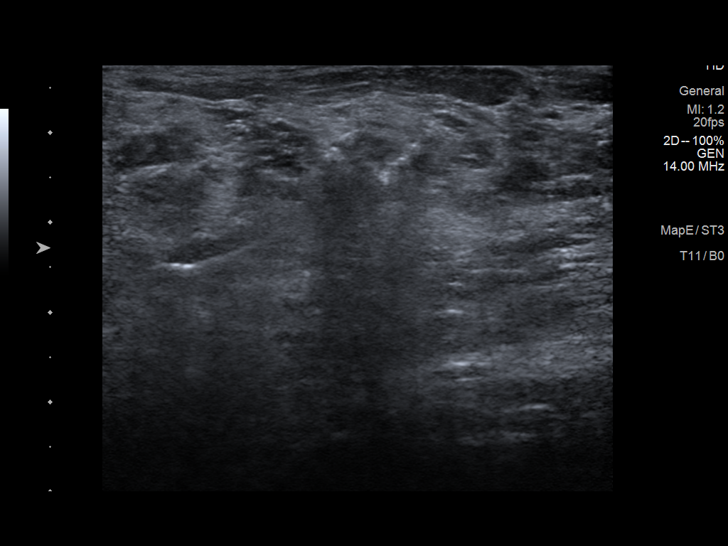
[im 6/10]
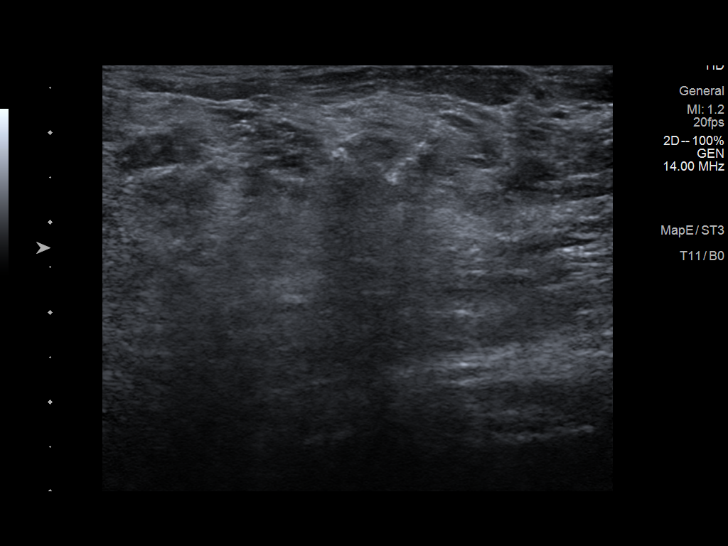
[im 7/10]
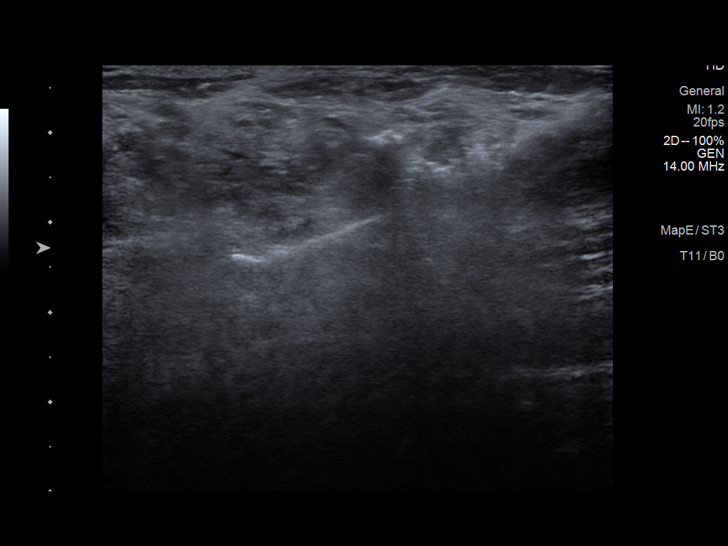
[im 8/10]
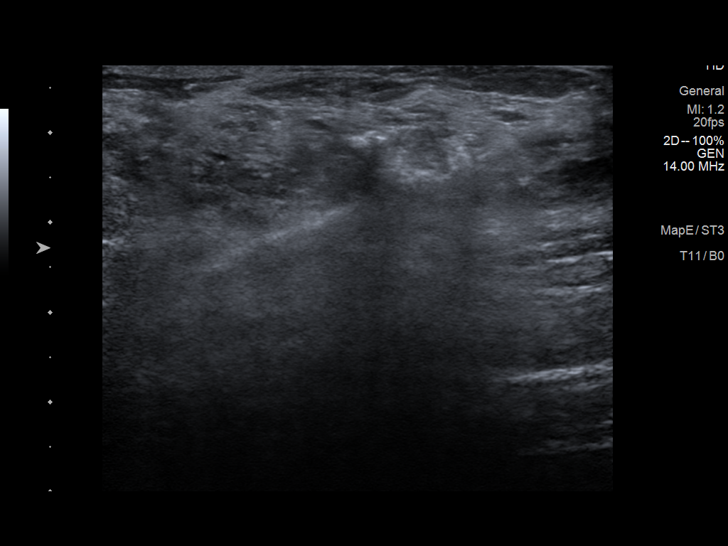
[im 9/10]
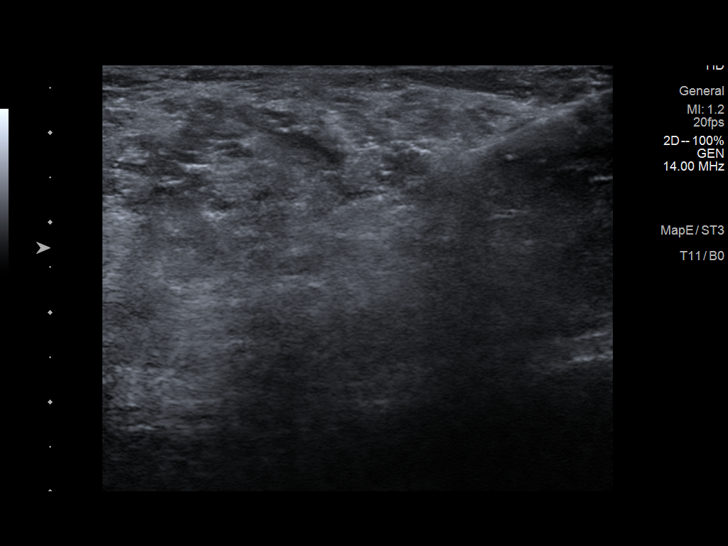
[im 10/10]
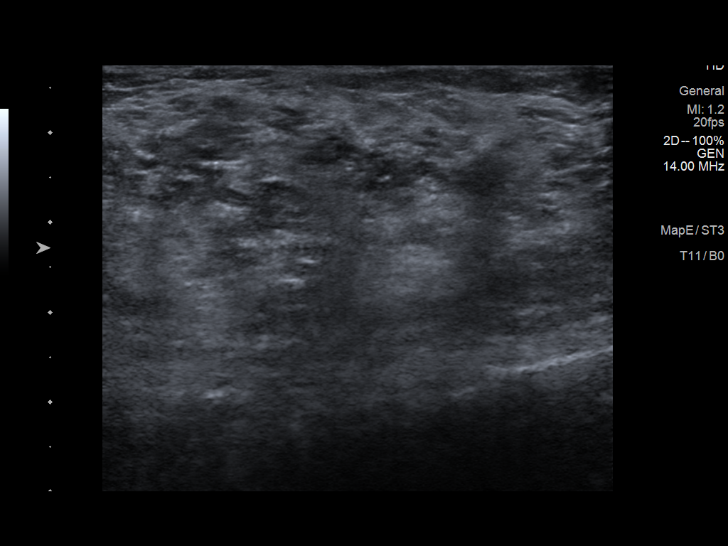

[10 of 10 positions shown; findings below may reference images not displayed]



Lesion quadrant: 2 o'clock

Using sterile technique and 1% Lidocaine as local anesthetic, under
direct ultrasound visualization, a 12 gauge Cam[place] device was
used to perform biopsy of a 2 o'clock right breast mass using a
lateral approach. At the conclusion of the procedure tissue marker
clip was deployed into the biopsy cavity. Follow up 2 view mammogram
was performed and dictated separately.
IMPRESSION: Ultrasound guided biopsy of a 2 o'clock right breast mass. No
apparent complications.

ADDENDUM:
Pathology revealed FIBROCYSTIC CHANGE WITH FOCAL SCLEROSING ADENOSIS
of the Right, 2 o'clock. There are portions of a large benign cyst
with inspissated secretions. This was found to be concordant by Dr.
Darnor Kontoh.

Pathology results were discussed with the patient by telephone. The
patient reported doing well after the biopsy with tenderness and
bleeding at the site. Post biopsy instructions and care were
reviewed and questions were answered. The patient was encouraged to
call The [REDACTED] for any additional
concerns. My direct phone number was provided.

The patient was instructed to return for annual screening
mammography and informed a reminder notice would be sent regarding
this appointment.

Pathology results reported by Mileidy Scholl, RN on 04/24/2020.



Lesion quadrant: 2 o'clock

Using sterile technique and 1% Lidocaine as local anesthetic, under
direct ultrasound visualization, a 12 gauge Camillus device was
used to perform biopsy of a 2 o'clock right breast mass using a
lateral approach. At the conclusion of the procedure tissue marker
clip was deployed into the biopsy cavity. Follow up 2 view mammogram
was performed and dictated separately.
IMPRESSION: Ultrasound guided biopsy of a 2 o'clock right breast mass. No
apparent complications.

## 2021-08-24 IMAGING — MG MM BREAST LOCALIZATION CLIP
4 series · 4 of 12 positions shown · non-contrast
Comparison: Previous exam(s).

CLINICAL DATA: Evaluate biopsy marker

EXAM:
DIAGNOSTIC RIGHT MAMMOGRAM POST ULTRASOUND BIOPSY

[R CC synth-2D]
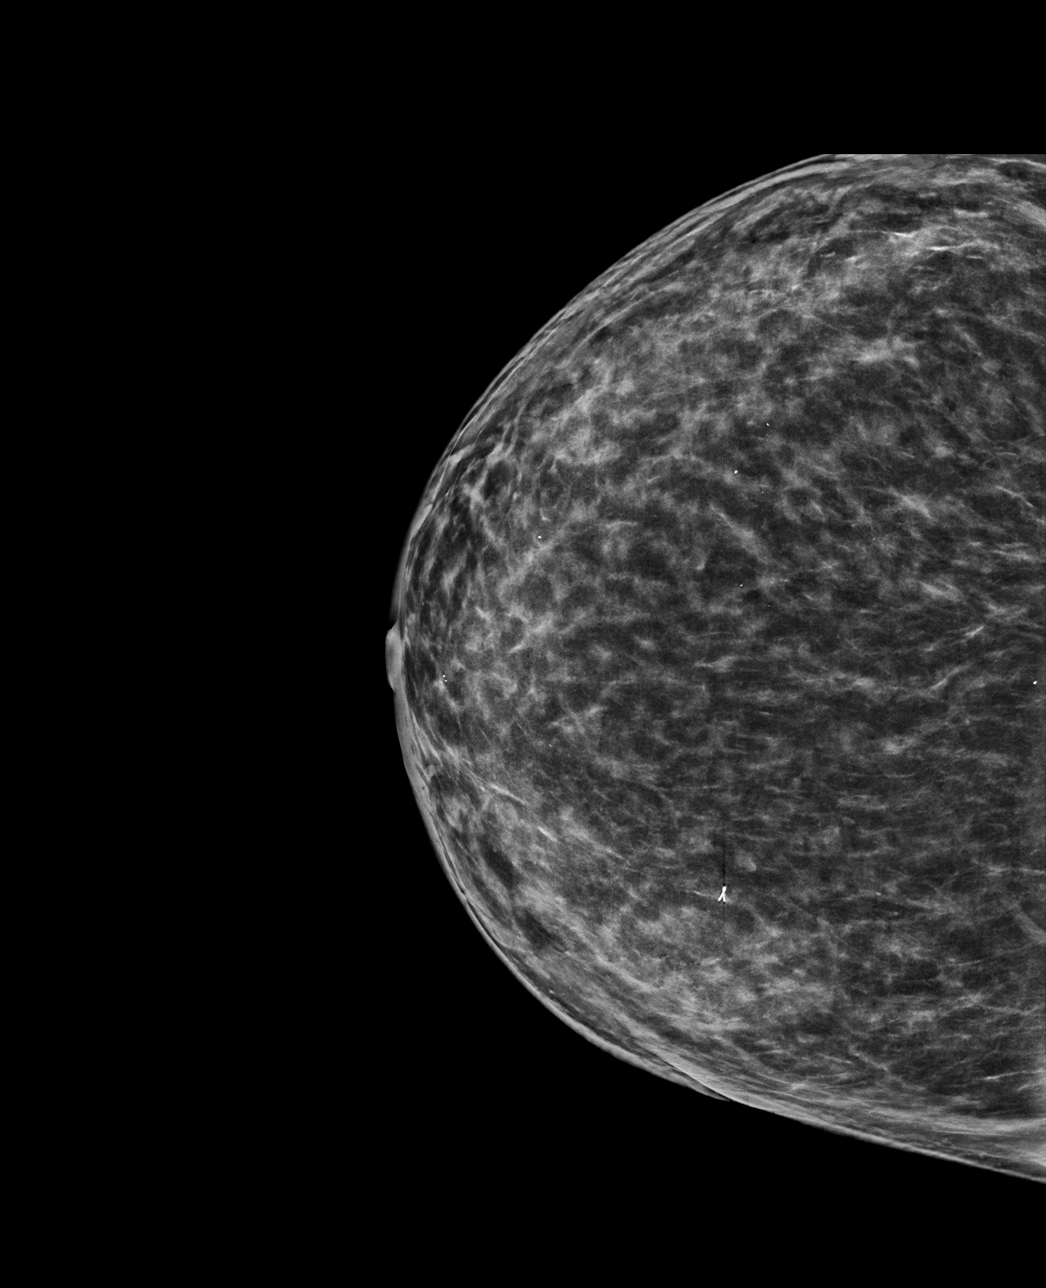

[R ML synth-2D]
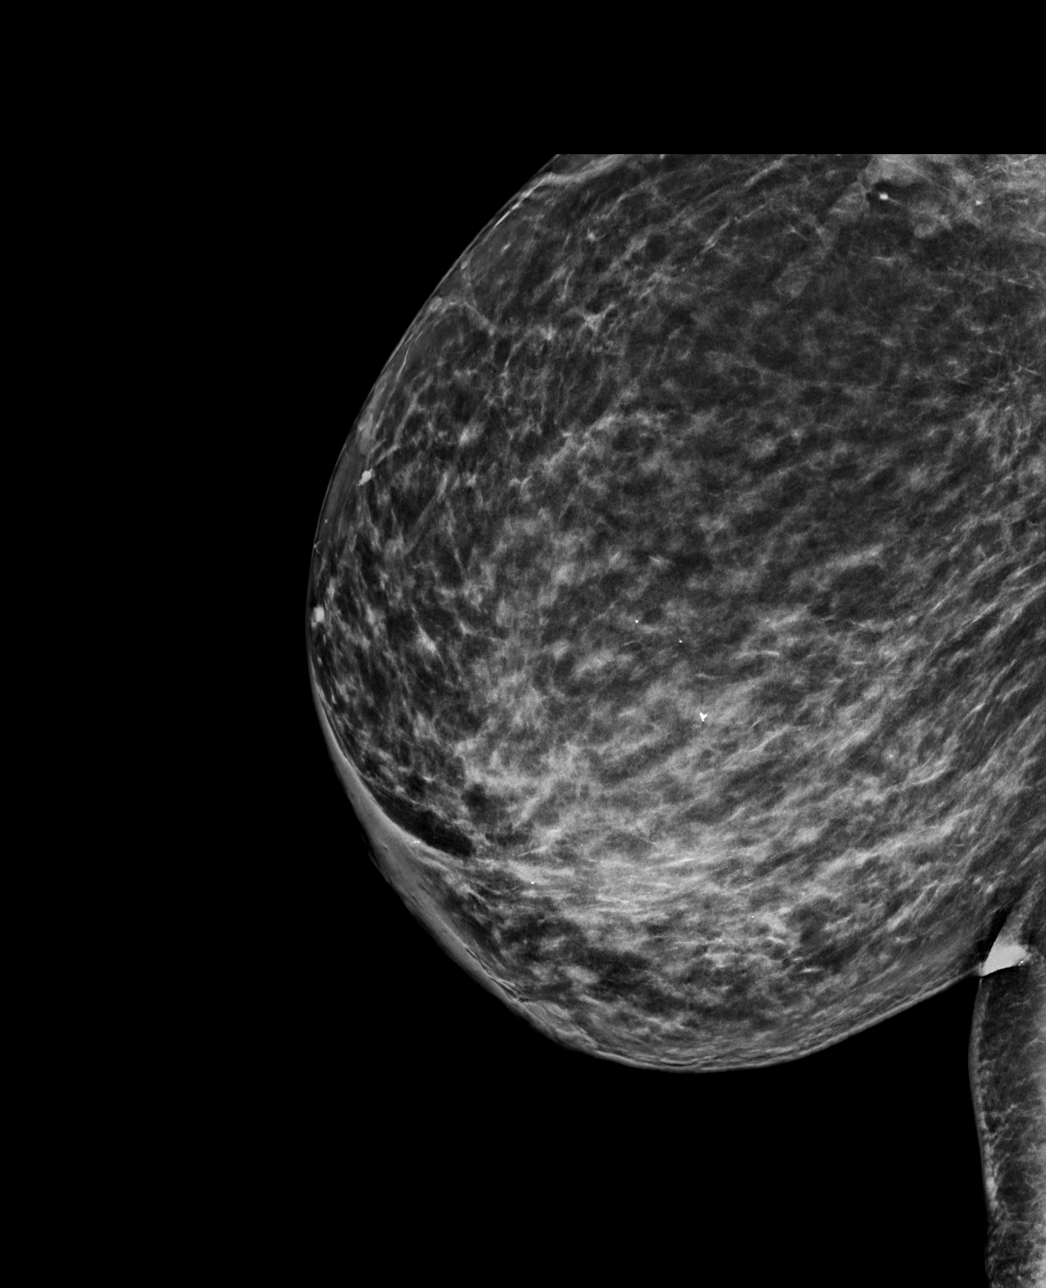

[R ML tomo · tomo slice 43/86.0]
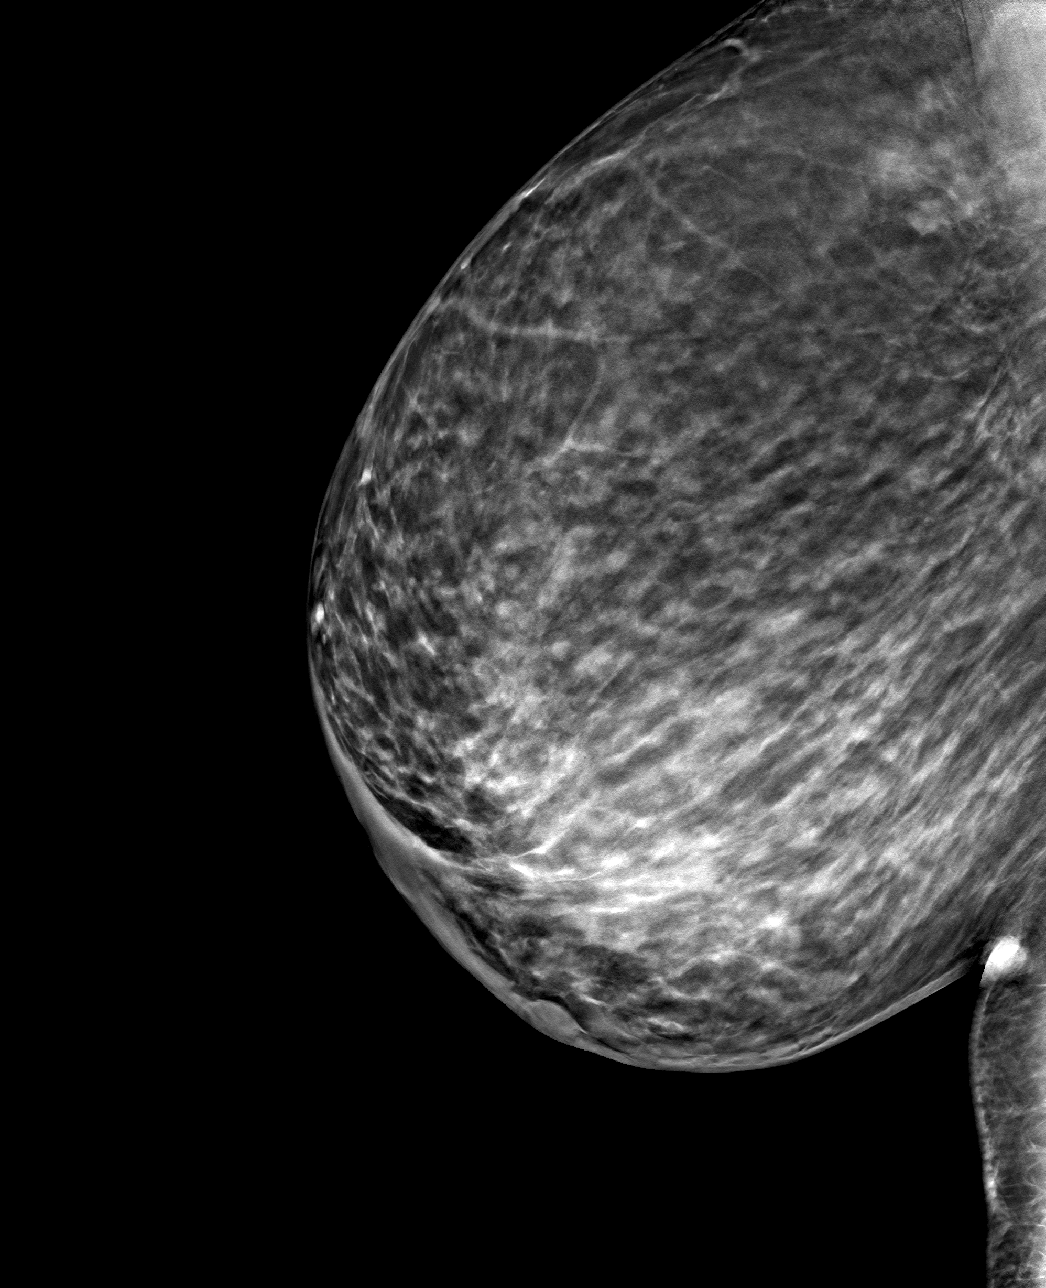

[R CC tomo · tomo slice 41/82.0]
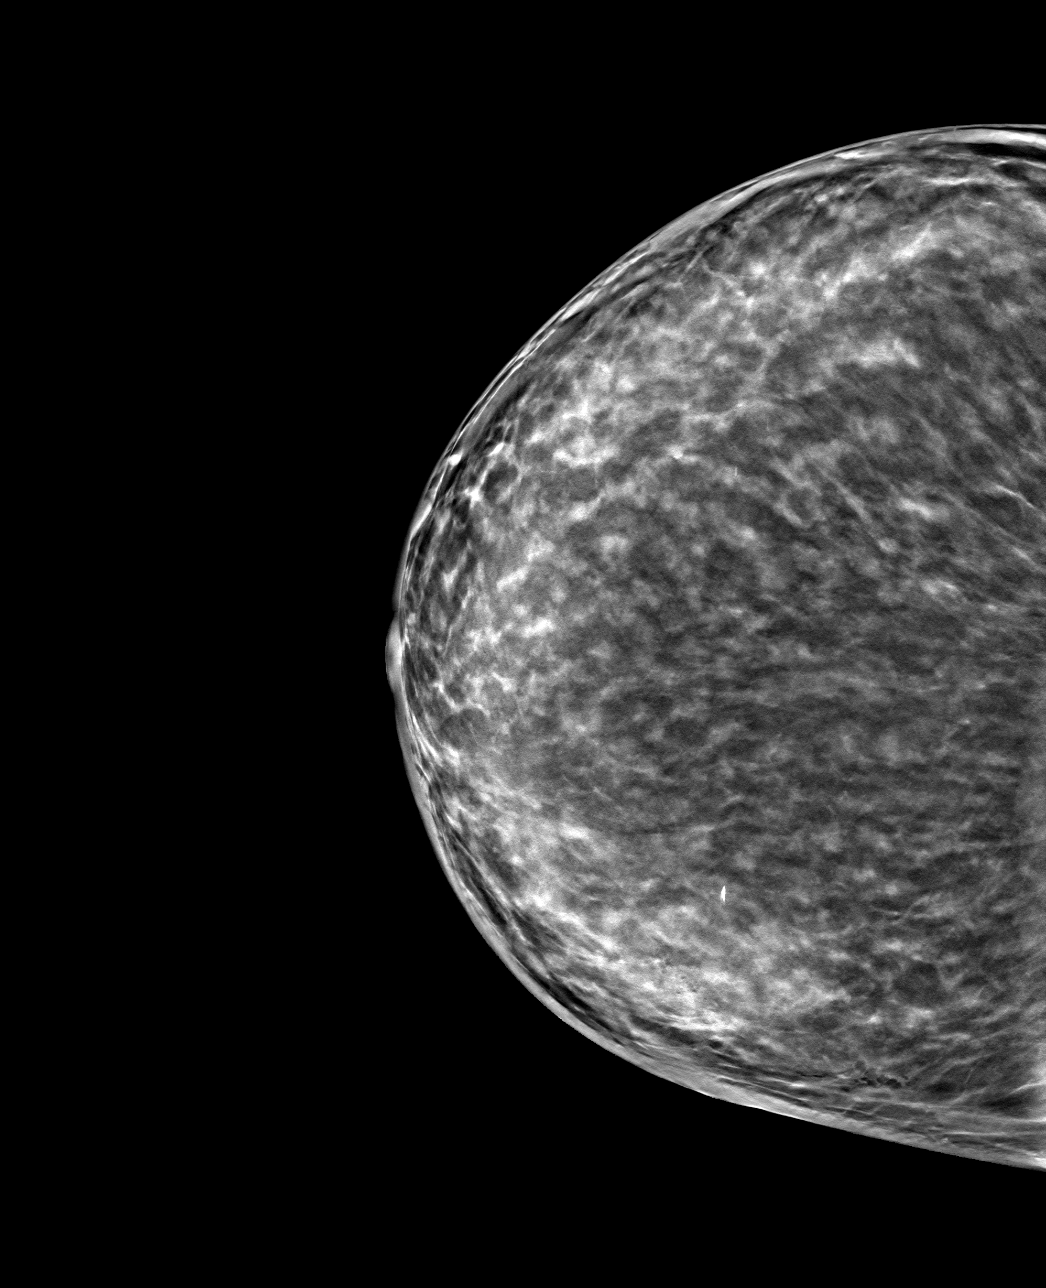

[4 of 12 positions shown; findings below may reference images not displayed]

FINDINGS: Mammographic images were obtained following ultrasound guided biopsy
of a right breast mass. The biopsy marking clip is 1 cm medial and 6
mm posterior to the center of the biopsied mass. The mass is much
less prominent and has nearly resolved.
IMPRESSION: The biopsy clip is 1 cm medial and 6 mm posterior to the center of
the biopsied mass. The mass is much less prominent and has nearly
resolved.

Final Assessment: Post Procedure Mammograms for Marker Placement

## 2022-01-14 DIAGNOSIS — Z0000 Encounter for general adult medical examination without abnormal findings: Secondary | ICD-10-CM | POA: Diagnosis not present

## 2022-01-14 DIAGNOSIS — E559 Vitamin D deficiency, unspecified: Secondary | ICD-10-CM | POA: Diagnosis not present

## 2022-01-14 DIAGNOSIS — E6609 Other obesity due to excess calories: Secondary | ICD-10-CM | POA: Diagnosis not present

## 2022-01-14 DIAGNOSIS — Z6832 Body mass index (BMI) 32.0-32.9, adult: Secondary | ICD-10-CM | POA: Diagnosis not present

## 2022-01-14 DIAGNOSIS — H401131 Primary open-angle glaucoma, bilateral, mild stage: Secondary | ICD-10-CM | POA: Diagnosis not present

## 2022-01-14 DIAGNOSIS — E7800 Pure hypercholesterolemia, unspecified: Secondary | ICD-10-CM | POA: Diagnosis not present

## 2022-04-11 DIAGNOSIS — H401131 Primary open-angle glaucoma, bilateral, mild stage: Secondary | ICD-10-CM | POA: Diagnosis not present

## 2022-04-11 DIAGNOSIS — H25813 Combined forms of age-related cataract, bilateral: Secondary | ICD-10-CM | POA: Diagnosis not present

## 2022-04-11 DIAGNOSIS — H35033 Hypertensive retinopathy, bilateral: Secondary | ICD-10-CM | POA: Diagnosis not present

## 2022-04-11 DIAGNOSIS — H34233 Retinal artery branch occlusion, bilateral: Secondary | ICD-10-CM | POA: Diagnosis not present

## 2022-05-11 DIAGNOSIS — H401131 Primary open-angle glaucoma, bilateral, mild stage: Secondary | ICD-10-CM | POA: Diagnosis not present

## 2022-05-18 ENCOUNTER — Other Ambulatory Visit: Payer: Self-pay | Admitting: Family Medicine

## 2022-05-18 DIAGNOSIS — Z1231 Encounter for screening mammogram for malignant neoplasm of breast: Secondary | ICD-10-CM

## 2022-06-29 ENCOUNTER — Ambulatory Visit
Admission: RE | Admit: 2022-06-29 | Discharge: 2022-06-29 | Disposition: A | Discharge status: Home / Self Care | Payer: 59 | Source: Ambulatory Visit | Attending: Family Medicine | Admitting: Family Medicine

## 2022-06-29 DIAGNOSIS — Z1231 Encounter for screening mammogram for malignant neoplasm of breast: Secondary | ICD-10-CM

## 2022-08-29 DIAGNOSIS — H401131 Primary open-angle glaucoma, bilateral, mild stage: Secondary | ICD-10-CM | POA: Diagnosis not present

## 2023-02-07 DIAGNOSIS — E559 Vitamin D deficiency, unspecified: Secondary | ICD-10-CM | POA: Diagnosis not present

## 2023-02-07 DIAGNOSIS — Z0000 Encounter for general adult medical examination without abnormal findings: Secondary | ICD-10-CM | POA: Diagnosis not present

## 2023-02-07 DIAGNOSIS — Z1211 Encounter for screening for malignant neoplasm of colon: Secondary | ICD-10-CM | POA: Diagnosis not present

## 2023-02-07 DIAGNOSIS — E669 Obesity, unspecified: Secondary | ICD-10-CM | POA: Diagnosis not present

## 2023-02-07 DIAGNOSIS — H401131 Primary open-angle glaucoma, bilateral, mild stage: Secondary | ICD-10-CM | POA: Diagnosis not present

## 2023-02-07 DIAGNOSIS — E7800 Pure hypercholesterolemia, unspecified: Secondary | ICD-10-CM | POA: Diagnosis not present

## 2023-04-06 DIAGNOSIS — Z1211 Encounter for screening for malignant neoplasm of colon: Secondary | ICD-10-CM | POA: Diagnosis not present

## 2023-05-02 DIAGNOSIS — E559 Vitamin D deficiency, unspecified: Secondary | ICD-10-CM | POA: Diagnosis not present

## 2023-05-02 DIAGNOSIS — Z6832 Body mass index (BMI) 32.0-32.9, adult: Secondary | ICD-10-CM | POA: Diagnosis not present

## 2023-05-02 DIAGNOSIS — E6609 Other obesity due to excess calories: Secondary | ICD-10-CM | POA: Diagnosis not present

## 2023-05-02 DIAGNOSIS — E7800 Pure hypercholesterolemia, unspecified: Secondary | ICD-10-CM | POA: Diagnosis not present

## 2023-05-09 ENCOUNTER — Other Ambulatory Visit: Payer: Self-pay | Admitting: Family Medicine

## 2023-05-09 DIAGNOSIS — Z1231 Encounter for screening mammogram for malignant neoplasm of breast: Secondary | ICD-10-CM

## 2023-06-27 DIAGNOSIS — E559 Vitamin D deficiency, unspecified: Secondary | ICD-10-CM | POA: Diagnosis not present

## 2023-06-27 DIAGNOSIS — E7800 Pure hypercholesterolemia, unspecified: Secondary | ICD-10-CM | POA: Diagnosis not present

## 2023-06-27 DIAGNOSIS — E6609 Other obesity due to excess calories: Secondary | ICD-10-CM | POA: Diagnosis not present

## 2023-06-27 DIAGNOSIS — Z6831 Body mass index (BMI) 31.0-31.9, adult: Secondary | ICD-10-CM | POA: Diagnosis not present

## 2023-07-03 ENCOUNTER — Ambulatory Visit
Admission: RE | Admit: 2023-07-03 | Discharge: 2023-07-03 | Disposition: A | Discharge status: Home / Self Care | Payer: 59 | Source: Ambulatory Visit | Attending: Family Medicine | Admitting: Family Medicine

## 2023-07-03 DIAGNOSIS — Z1231 Encounter for screening mammogram for malignant neoplasm of breast: Secondary | ICD-10-CM | POA: Diagnosis not present

## 2023-07-03 DIAGNOSIS — H40013 Open angle with borderline findings, low risk, bilateral: Secondary | ICD-10-CM | POA: Diagnosis not present

## 2023-07-03 DIAGNOSIS — H2513 Age-related nuclear cataract, bilateral: Secondary | ICD-10-CM | POA: Diagnosis not present

## 2023-07-03 DIAGNOSIS — H40053 Ocular hypertension, bilateral: Secondary | ICD-10-CM | POA: Diagnosis not present

## 2023-08-22 DIAGNOSIS — E6609 Other obesity due to excess calories: Secondary | ICD-10-CM | POA: Diagnosis not present

## 2023-08-22 DIAGNOSIS — E559 Vitamin D deficiency, unspecified: Secondary | ICD-10-CM | POA: Diagnosis not present

## 2023-08-22 DIAGNOSIS — E7800 Pure hypercholesterolemia, unspecified: Secondary | ICD-10-CM | POA: Diagnosis not present

## 2023-08-22 DIAGNOSIS — Z6831 Body mass index (BMI) 31.0-31.9, adult: Secondary | ICD-10-CM | POA: Diagnosis not present

## 2023-12-05 DIAGNOSIS — E6609 Other obesity due to excess calories: Secondary | ICD-10-CM | POA: Diagnosis not present

## 2023-12-05 DIAGNOSIS — E7800 Pure hypercholesterolemia, unspecified: Secondary | ICD-10-CM | POA: Diagnosis not present

## 2023-12-05 DIAGNOSIS — Z6831 Body mass index (BMI) 31.0-31.9, adult: Secondary | ICD-10-CM | POA: Diagnosis not present

## 2024-03-04 DIAGNOSIS — Z0000 Encounter for general adult medical examination without abnormal findings: Secondary | ICD-10-CM | POA: Diagnosis not present

## 2024-03-04 DIAGNOSIS — N841 Polyp of cervix uteri: Secondary | ICD-10-CM | POA: Diagnosis not present

## 2024-03-04 DIAGNOSIS — H6123 Impacted cerumen, bilateral: Secondary | ICD-10-CM | POA: Diagnosis not present

## 2024-03-04 DIAGNOSIS — E7800 Pure hypercholesterolemia, unspecified: Secondary | ICD-10-CM | POA: Diagnosis not present

## 2024-03-04 DIAGNOSIS — E559 Vitamin D deficiency, unspecified: Secondary | ICD-10-CM | POA: Diagnosis not present

## 2024-03-04 DIAGNOSIS — H401131 Primary open-angle glaucoma, bilateral, mild stage: Secondary | ICD-10-CM | POA: Diagnosis not present

## 2024-03-04 DIAGNOSIS — Z124 Encounter for screening for malignant neoplasm of cervix: Secondary | ICD-10-CM | POA: Diagnosis not present

## 2024-03-04 DIAGNOSIS — E669 Obesity, unspecified: Secondary | ICD-10-CM | POA: Diagnosis not present

## 2024-03-05 DIAGNOSIS — N841 Polyp of cervix uteri: Secondary | ICD-10-CM | POA: Diagnosis not present

## 2024-04-18 DIAGNOSIS — E6609 Other obesity due to excess calories: Secondary | ICD-10-CM | POA: Diagnosis not present

## 2024-04-18 DIAGNOSIS — E7800 Pure hypercholesterolemia, unspecified: Secondary | ICD-10-CM | POA: Diagnosis not present

## 2024-04-18 DIAGNOSIS — Z6833 Body mass index (BMI) 33.0-33.9, adult: Secondary | ICD-10-CM | POA: Diagnosis not present

## 2024-04-25 ENCOUNTER — Other Ambulatory Visit: Payer: Self-pay | Admitting: Family Medicine

## 2024-04-25 DIAGNOSIS — Z1231 Encounter for screening mammogram for malignant neoplasm of breast: Secondary | ICD-10-CM

## 2024-07-08 ENCOUNTER — Ambulatory Visit

## 2024-07-08 ENCOUNTER — Ambulatory Visit
Admission: RE | Admit: 2024-07-08 | Discharge: 2024-07-08 | Disposition: A | Discharge status: Home / Self Care | Source: Ambulatory Visit | Attending: Family Medicine | Admitting: Family Medicine

## 2024-07-08 DIAGNOSIS — Z1231 Encounter for screening mammogram for malignant neoplasm of breast: Secondary | ICD-10-CM | POA: Diagnosis not present

## 2024-07-11 DIAGNOSIS — E559 Vitamin D deficiency, unspecified: Secondary | ICD-10-CM | POA: Diagnosis not present

## 2024-07-11 DIAGNOSIS — E6609 Other obesity due to excess calories: Secondary | ICD-10-CM | POA: Diagnosis not present

## 2024-07-11 DIAGNOSIS — E7800 Pure hypercholesterolemia, unspecified: Secondary | ICD-10-CM | POA: Diagnosis not present

## 2024-07-11 DIAGNOSIS — Z6831 Body mass index (BMI) 31.0-31.9, adult: Secondary | ICD-10-CM | POA: Diagnosis not present

## 2024-08-20 DIAGNOSIS — E6609 Other obesity due to excess calories: Secondary | ICD-10-CM | POA: Diagnosis not present

## 2024-08-20 DIAGNOSIS — E7800 Pure hypercholesterolemia, unspecified: Secondary | ICD-10-CM | POA: Diagnosis not present

## 2024-08-20 DIAGNOSIS — E559 Vitamin D deficiency, unspecified: Secondary | ICD-10-CM | POA: Diagnosis not present

## 2024-08-20 DIAGNOSIS — Z6831 Body mass index (BMI) 31.0-31.9, adult: Secondary | ICD-10-CM | POA: Diagnosis not present
